# Patient Record
Sex: Male | Born: 1960 | Race: White | Hispanic: No
Health system: Southern US, Community
[De-identification: ages and names within clinical notes are randomized; demographics above are authoritative.]

---

## 2008-02-22 ENCOUNTER — Ambulatory Visit: Payer: Self-pay | Admitting: Orthopedic Surgery

## 2008-03-18 ENCOUNTER — Ambulatory Visit: Payer: Self-pay | Admitting: Orthopedic Surgery

## 2008-03-26 ENCOUNTER — Inpatient Hospital Stay: Payer: Self-pay | Admitting: Orthopedic Surgery

## 2008-04-21 ENCOUNTER — Other Ambulatory Visit: Payer: Self-pay

## 2008-04-21 ENCOUNTER — Inpatient Hospital Stay: Payer: Self-pay | Admitting: Orthopedic Surgery

## 2009-06-12 IMAGING — CR RIGHT HIP - 1 VIEW
1 series · 1 of 1 positions shown · non-contrast
Comparison: none

REASON FOR EXAM: repair right hip dysplasia
COMMENTS:

PROCEDURE:     DXR - DXR HIP RIGHT ONE VIEW  - March 26, 2008 [DATE]
RESULT:     The patient is status post RIGHT hip arthroplasty. There is no
evidence of an immediate postoperative bone or hardware complication.

[view not recorded]
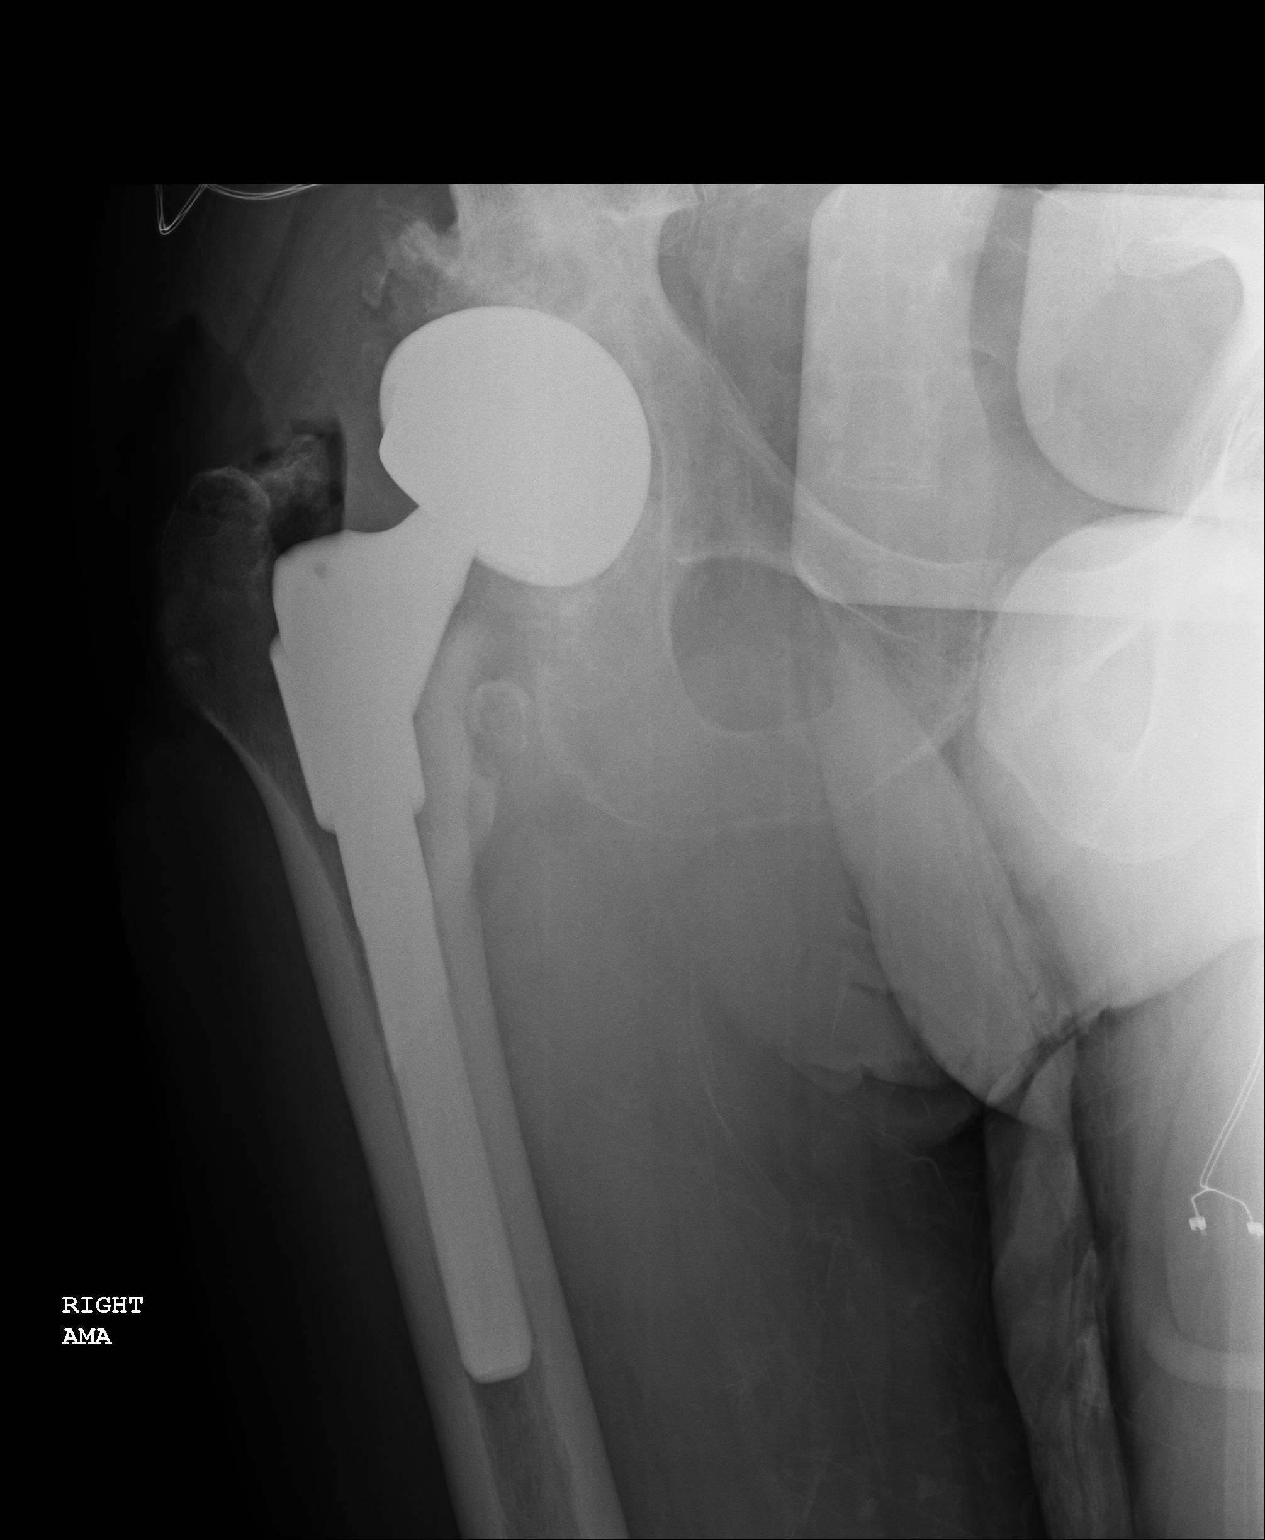

[1 of 1 positions shown; findings below may reference images not displayed]

IMPRESSION: Please see above.

## 2009-07-10 IMAGING — CR RIGHT HIP - 1 VIEW
1 series · 1 of 1 positions shown · non-contrast
Comparison: none

REASON FOR EXAM: rt hip pt in or
COMMENTS:

[view not recorded]
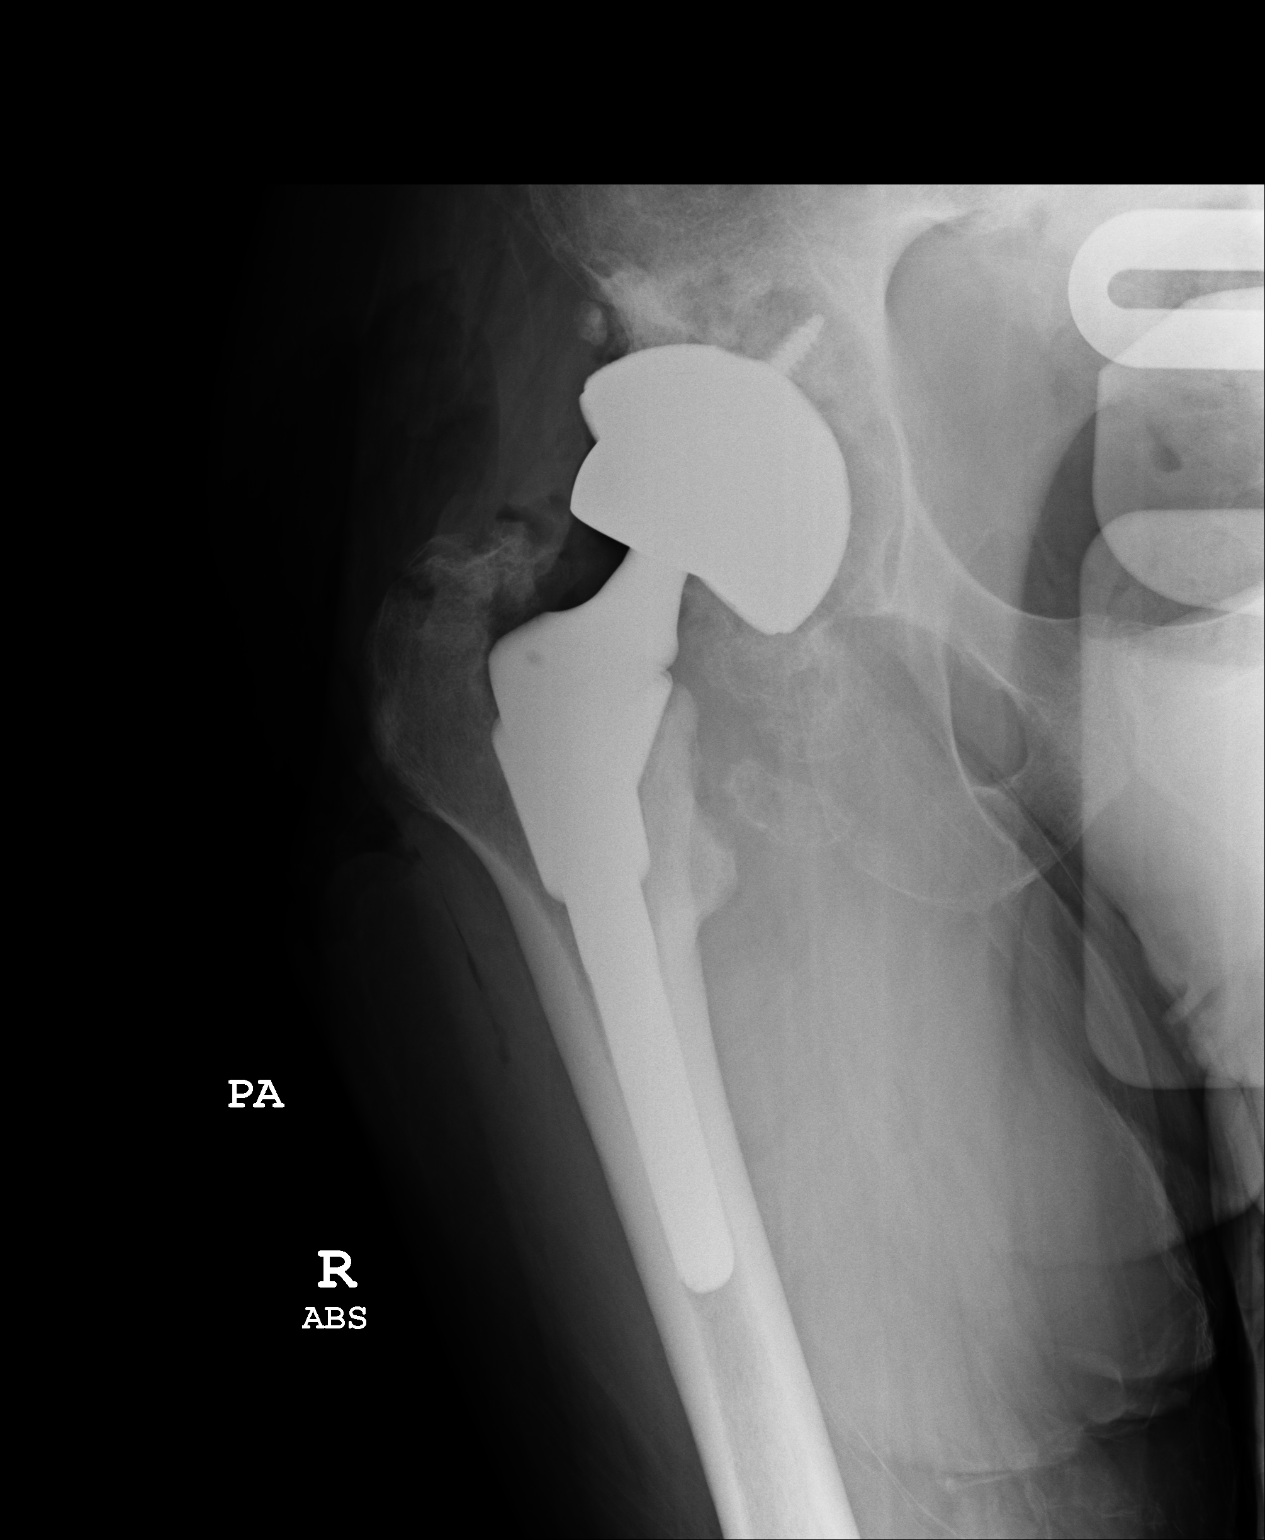

[1 of 1 positions shown; findings below may reference images not displayed]

PROCEDURE:     DXR - DXR HIP RIGHT ONE VIEW  - April 23, 2008  [DATE]

RESULT:     An AP view of the RIGHT hip was obtained and is compared to the
prior exam obtained earlier this date. The patient is again noted to have
had the femoral component of the hip prosthesis reassembled. The prosthetic
acetabulum is in place and is stabilized by a screw. No dislocation at
prosthetic hip joint is seen.
IMPRESSION: Please see above.

## 2009-07-10 IMAGING — CR RIGHT HIP - 1 VIEW
1 series · 1 of 1 positions shown · non-contrast
Comparison: none

REASON FOR EXAM: PT IN SURGERY
COMMENTS:

PROCEDURE:     DXR - DXR HIP RIGHT ONE VIEW  - April 23, 2008  [DATE]
RESULT:     An AP view of the RIGHT hip obtained at surgery is compared to a
prior exam 03/26/2008. A portion of the femoral component of the previously
present RIGHT hip prosthesis has been removed.

[view not recorded]
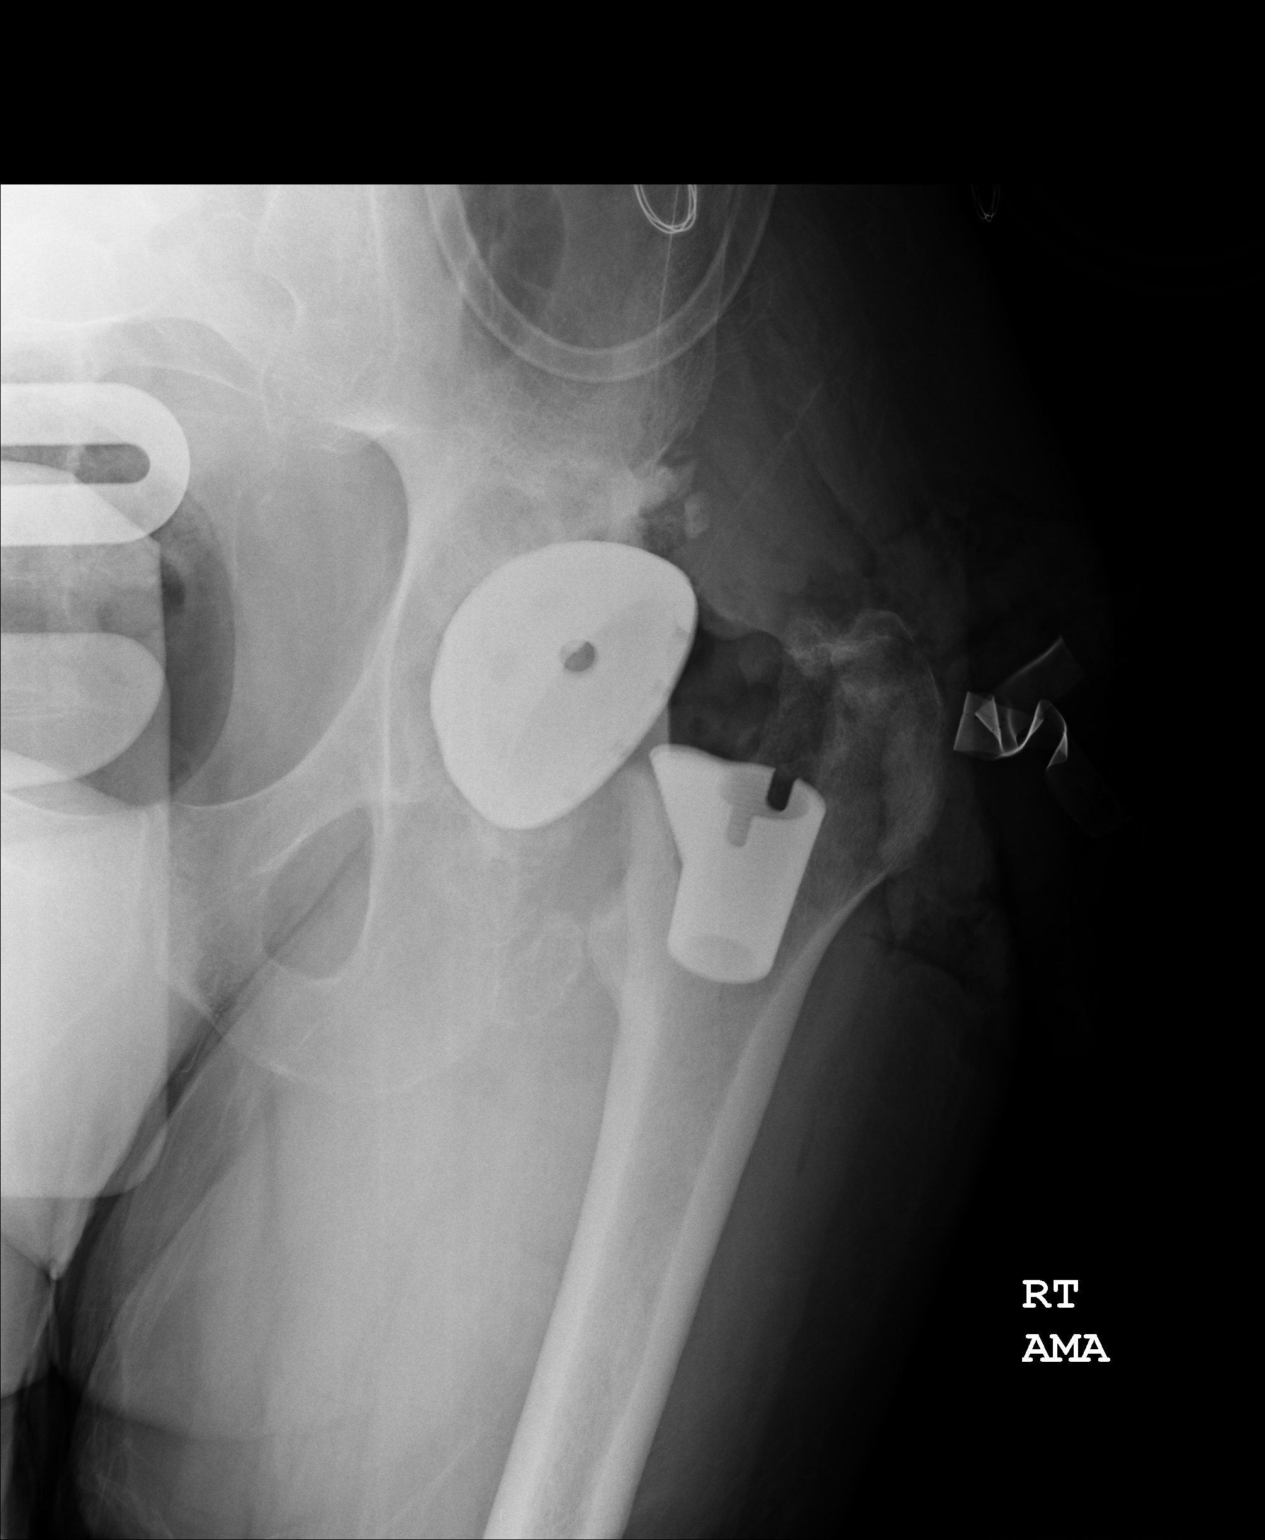

[1 of 1 positions shown; findings below may reference images not displayed]

IMPRESSION: Please see above.

## 2012-12-10 ENCOUNTER — Inpatient Hospital Stay: Payer: Self-pay | Admitting: Psychiatry

## 2012-12-10 LAB — BASIC METABOLIC PANEL
Anion Gap: 17 — ABNORMAL HIGH (ref 7–16)
Co2: 16 mmol/L — ABNORMAL LOW (ref 21–32)
EGFR (African American): >60
EGFR (Non-African Amer.): >60
Glucose: 154 mg/dL — ABNORMAL HIGH (ref 65–99)
Osmolality: 267 (ref 275–301)
Potassium: 4 mmol/L (ref 3.5–5.1)

## 2012-12-10 LAB — DRUG SCREEN, URINE
Amphetamines, Ur Screen: NEGATIVE (ref ?–1000)
Benzodiazepine, Ur Scrn: NEGATIVE (ref ?–200)
Cannabinoid 50 Ng, Ur ~~LOC~~: NEGATIVE (ref ?–50)
Cocaine Metabolite,Ur ~~LOC~~: NEGATIVE (ref ?–300)
MDMA (Ecstasy)Ur Screen: NEGATIVE (ref ?–500)
Methadone, Ur Screen: NEGATIVE (ref ?–300)
Tricyclic, Ur Screen: NEGATIVE (ref ?–1000)

## 2012-12-10 LAB — CBC WITH DIFFERENTIAL/PLATELET
Basophil #: 0.1 10*3/uL (ref 0.0–0.1)
Eosinophil %: 1.3 %
HCT: 39.8 % — ABNORMAL LOW (ref 40.0–52.0)
HGB: 13.8 g/dL (ref 13.0–18.0)
MCH: 38.8 pg — ABNORMAL HIGH (ref 26.0–34.0)
Monocyte %: 12.2 %
RBC: 3.55 10*6/uL — ABNORMAL LOW (ref 4.40–5.90)
RDW: 14.2 % (ref 11.5–14.5)
WBC: 6.4 10*3/uL (ref 3.8–10.6)

## 2012-12-10 LAB — HEPATIC FUNCTION PANEL A (ARMC)
Albumin: 3.9 g/dL (ref 3.4–5.0)
Alkaline Phosphatase: 132 U/L (ref 50–136)
Bilirubin, Direct: 0.8 mg/dL — ABNORMAL HIGH (ref 0.00–0.20)
SGPT (ALT): 125 U/L — ABNORMAL HIGH (ref 12–78)

## 2012-12-10 LAB — TSH: Thyroid Stimulating Horm: 2.36 u[IU]/mL

## 2012-12-10 LAB — URINALYSIS, COMPLETE
Bilirubin,UR: NEGATIVE
Blood: NEGATIVE
Glucose,UR: NEGATIVE mg/dL (ref 0–75)
Hyaline Cast: 21
Leukocyte Esterase: NEGATIVE
Nitrite: NEGATIVE
Ph: 7 (ref 4.5–8.0)
Squamous Epithelial: NONE SEEN
WBC UR: 2 /HPF (ref 0–5)

## 2012-12-10 LAB — MAGNESIUM: Magnesium: 1.8 mg/dL

## 2012-12-11 LAB — BASIC METABOLIC PANEL
Calcium, Total: 8.6 mg/dL (ref 8.5–10.1)
Co2: 24 mmol/L (ref 21–32)
Creatinine: 0.56 mg/dL — ABNORMAL LOW (ref 0.60–1.30)
EGFR (African American): >60
EGFR (Non-African Amer.): >60
Glucose: 85 mg/dL (ref 65–99)
Potassium: 3.6 mmol/L (ref 3.5–5.1)
Sodium: 139 mmol/L (ref 136–145)

## 2012-12-11 LAB — CBC WITH DIFFERENTIAL/PLATELET
Basophil #: 0 10*3/uL (ref 0.0–0.1)
Basophil %: 0.6 %
Eosinophil #: 0.1 10*3/uL (ref 0.0–0.7)
Eosinophil %: 2.2 %
Monocyte %: 16.3 %
Neutrophil %: 55.4 %
Platelet: 71 10*3/uL — ABNORMAL LOW (ref 150–440)
RBC: 3.33 10*6/uL — ABNORMAL LOW (ref 4.40–5.90)
WBC: 3.1 10*3/uL — ABNORMAL LOW (ref 3.8–10.6)

## 2012-12-11 LAB — HEMOGLOBIN A1C: Hemoglobin A1C: 5.1 % (ref 4.2–6.3)

## 2012-12-11 LAB — HEPATIC FUNCTION PANEL A (ARMC)
Albumin: 3.5 g/dL (ref 3.4–5.0)
Alkaline Phosphatase: 107 U/L (ref 50–136)
Bilirubin, Direct: 0.6 mg/dL — ABNORMAL HIGH (ref 0.00–0.20)
SGOT(AST): 176 U/L — ABNORMAL HIGH (ref 15–37)
SGPT (ALT): 110 U/L — ABNORMAL HIGH (ref 12–78)
Total Protein: 7 g/dL (ref 6.4–8.2)

## 2012-12-11 LAB — MAGNESIUM: Magnesium: 2.3 mg/dL

## 2012-12-12 LAB — CBC WITH DIFFERENTIAL/PLATELET
Basophil %: 0.6 %
Eosinophil #: 0.1 10*3/uL (ref 0.0–0.7)
Eosinophil %: 2.5 %
HCT: 35.2 % — ABNORMAL LOW (ref 40.0–52.0)
HGB: 12.4 g/dL — ABNORMAL LOW (ref 13.0–18.0)
Lymphocyte #: 0.9 10*3/uL — ABNORMAL LOW (ref 1.0–3.6)
Lymphocyte %: 24.7 %
MCH: 39.3 pg — ABNORMAL HIGH (ref 26.0–34.0)
MCHC: 35.2 g/dL (ref 32.0–36.0)
MCV: 112 fL — ABNORMAL HIGH (ref 80–100)
Monocyte #: 0.7 x10 3/mm (ref 0.2–1.0)
Monocyte %: 19.8 %
Platelet: 70 10*3/uL — ABNORMAL LOW (ref 150–440)
RDW: 14.1 % (ref 11.5–14.5)
WBC: 3.5 10*3/uL — ABNORMAL LOW (ref 3.8–10.6)

## 2014-08-16 DEATH — deceased

## 2014-12-06 NOTE — H&P (Signed)
PATIENT NAME:  Patrick Jennings, Patrick Jennings MR#:  [id number] DATE OF BIRTH:  Oct 09, 1960  DATE OF ADMISSION:  12/10/2012  PCP: None.  REFERRING PHYSICIAN: Dr. Shaune Jennings.   CHIEF COMPLAINT: Status post seizure.   HISTORY OF PRESENT ILLNESS: The patient is a pleasant 54 year old Caucasian male, who is accompanied by his parents currently. Per parents, about 8:45 this [name]. he was noted to have a tonic-clonic seizure activity lasting about 10 minutes with tongue biting and urine incontinence. He was sitting on the recliner and he was brought down by his parents where he had a total of 10 minutes of seizure activity. EMS was called. Here he was given Ativan. U-tox is negative. Medicine was consulted for evaluation and management.   Of note, the patient had been living by himself for up to 15 years prior to moving in with his parents on Friday as parents suspected that he was having ongoing alcohol issues and brought him in so they can take care of him. His mom is a previous nurse. He states that his last alcohol intake was several months ago; however, his parents suspect that he has been drinking recently; however, parents state that he has never drunk in front of them because of respect issues, but he had been possibly drinking in his house. 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 He was also seen by psychiatry while here and deemed incompetent per ER physician. Currently, he is tremulous, anxious and tachycardic.   PAST MEDICAL HISTORY: History of prior hip surgery, history of arthritis and psoriasis.   ALLERGIES: None.   OUTPATIENT MEDICATIONS: Ibuprofen p.r.n.   SOCIAL HISTORY: No tobacco or drugs. He does not work. Alcohol abuse as above. 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   FAMILY HISTORY: Some history of seizures.   REVIEW OF SYSTEMS:   CONSTITUTIONAL: No fever, fatigue,  weakness or weight changes.  EYES: Denies blurry vision or glaucoma.  ENT: No tinnitus or hearing loss. He did bite his tongue with some bleeding from his tongue while he had the seizure.  RESPIRATORY: Denies cough, wheezing, hemoptysis, shortness of breath.  CARDIOVASCULAR: Denies chest pain, orthopnea or hypertension.  GASTROINTESTINAL: Denies nausea, vomiting, diarrhea, abdominal pain. Denies rectal bleeding or ulcers.  GENITOURINARY: Denies dysuria, hematuria.  HEMATOLOGIC/LYMPHATIC: Denies anemia or easy bruising. SKIN: Psoriasis.  MUSCULOSKELETAL: Has chronic arthritis. Denies numbness, weakness or dysarthria. Denies history of stroke or previous seizures.  PSYCHIATRIC: Occasional insomnia.   PHYSICAL EXAMINATION:  VITAL SIGNS: Temperature on arrival 98.3, pulse 116, respiratory rate 20, blood pressure 159/100, oxygen saturation 96% on room air.  GENERAL: The patient is a well developed, Caucasian male sitting in bed in no obvious distress, somewhat anxious, tremulous and unkempt.  HEENT: Normocephalic, atraumatic. Pupils are equal and reactive. Anicteric sclerae. Extraocular muscles intact. There is several bite marks lateral tongue on both sides without obvious bleeding currently.  NECK: Supple. No thyroid tenderness or cervical lymphadenopathy. No JVD.  CARDIOVASCULAR: S1, S2, tachycardic. No murmurs, rubs, or gallops.  LUNGS: Clear to auscultation. No wheezing or rhonchi.  ABDOMEN: Soft. Hyperactive bowel sounds in all quadrants. No tenderness.  EXTREMITIES: No significant lower extremity edema.  NEUROLOGIC: Cranial nerves II through XII appear to be grossly intact. Strength is 5 out of 5 in all extremities. Sensation is intact to light touch.  PSYCHIATRIC: Awake, alert, oriented x 3. Anxious and fidgety. The patient has significant tremor of his upper extremities as well and examination of the skin shows scattered diffuse subcentimeter plaques in patches, most notable in the lower  extremities, more so than upper extremities and left on the trunk.   LABORATORIES: CT of the head without contrast showing no acute intracranial process, 2 focal areas of white matter low attenuation in the left frontal lobe and right parietal lobe, which were symmetric and may also be related to microvascular disease, but demyelinating process or vasculitis are in differential diagnosis.   EKG: Sinus tach, rate is 105, no acute ST elevations or depressions. There are some peaked T waves in the precordial leads. U-tox negative. Alcohol level below detection. Sodium 132, potassium 4, serum CO2 is 16, anion gap of 17, glucose 154, BUN 10, creatinine 0.88. WBC 6.4, hemoglobin 13.8, platelets 75 and MCV of 12. Urinalysis negative.   ASSESSMENT AND PLAN: We have a 54 year old Caucasian male with a history of prolonged alcohol abuse, psoriasis and arthritis without history of seizures with new seizure tonic-clonic in nature, associated with tongue biting and urine incontinence. Although he denies it, the physical exam, symptoms and some lab abnormalities suggest that he has been drinking recently and likely had withdrawal seizure as he has been taken in by his parents in the last couple of days and he had not drank since; however, he denies drinking recently, but did not refute me after I stated that I suspected he had been drinking late last week. As this is the first seizure, I would not start AEDs at this point; however, start him on CIWA protocol, a banana bag, seizure precautions and frequent neuro checks. I would obtain an MRI of the brain to further evaluate findings seen on the CAT scan as well as do an EEG. I would start him on Librium standing as well as the CIWA protocol given the alcohol withdrawal symptoms h is having right now including anxiety, tachycardia and tremors with a seizure. I would check a magnesium. His U-tox is negative and he does want detox at this point it. He does have  thrombocytopenia, which I suspect is from alcohol. Furthermore, he appears to have metabolic acidosis, anion gap which could also be from alcohol on board. I would put him on a diet and if there is any significant MRI abnormalities, we will consider doing neuro consult. He does have mild hyponatremia, which should correct as well. He has elevated an glucose level and I will check a hemoglobin A1c as well. He was counseled extensively about alcohol in front of his parents and it is unclear if he has desire to quit at this point or not and that has yet to be seen; however, at this point, he does want detox "so I can move on with my life."   TOTAL TIME SPENT: 60 minutes.   CODE STATUS: The patient is full code.   ____________________________ Krystal Eaton, MD sa:aw D: 12/10/2012 13:08:35 ET T: 12/10/2012 13:24:55 ET JOB#: [id number]  cc: Krystal Eaton, MD, <Dictator> Krystal Eaton MD ELECTRONICALLY SIGNED 01/08/2013 13:54

## 2014-12-06 NOTE — Consult Note (Signed)
PATIENT NAME:  Patrick Jennings, Patrick Jennings MR#:  [id number] DATE OF BIRTH:  04-Jun-1961  DATE OF CONSULTATION:  12/10/2012  REFERRING PHYSICIAN:  Governor Rooks, MD CONSULTING PHYSICIAN:  Ardeen Fillers. Garnetta Buddy, MD  REASON FOR CONSULTATION: History of seizure, competency evaluation.   HISTORY OF PRESENT ILLNESS: The patient is a 54 year old single white male who was evaluated in the Emergency Department at the request of Dr. Shaune Pollack. The patient presented to the ED accompanied by his parent. According to the initial reports, patient has been living with his parents for the past 3 days. His parents were concerned because of the patient's excessive use of alcohol. The parents reported that patient had witnessed seizures earlier this morning. The patient reported that he was sitting on the couch around 8:45 a.m. when he passed out. His mother reported that she was talking to him and as she stepped out she saw the patient jerking on the sofa. She reported that she is a Engineer, civil (consulting) and she can tell if the patient was having actually a seizure. He was jerking for approximately 5 to 6 minutes. She stated that she called the ambulance and the patient was brought to the hospital. The patient reported that he does not remember anything except for coming to the hospital in the ambulance. The patient stated that he has not been drinking for the past 6 months. His mother, however, on the other side, reported that the patient was not drinking in the house but she can see the "signs." The patient continues to minimize his drinking but was noted to be tremulous and has a flushed face during the interview.   He reported that he is currently on probation for excessive drinking and has 2 DUIs in the past 18 months. He is currently on 18 months probation and has already completed 3 months out of that. He has to see his probation officer on a monthly basis. His urine drug screen was checked at that time. The patient reported that he has been following the  regulations. He reported that he does not think that he needs any treatment for his alcohol level. He reported that he is doing well on the AA meetings. The patient reported that he has not had a drink recently. His mother and father were very much concerned about his use of alcohol and they reported that he needs to get help. 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 He knows the consequences of using alcohol at this time and he does not have any depression, suicidal ideation or anxiety symptoms.   PAST PSYCHIATRIC HISTORY: The patient reported that he has never been admitted to a psychiatric hospital and has never been to a rehab program. He reported that he does not feel anxious, depressed and does not have any thoughts to harm himself. He reported that he drinks alcohol just socially. He was minimizing his use of alcohol. He reported that he has not had a drink in the past 6 months. He denies any treatment for detox or rehab.   PAST MEDICAL HISTORY: The patient has a history of a total knee placement. He denies having any other medical issues.   ALLERGIES: No known drug allergies.   CURRENT MEDICATIONS: None.   SOCIAL HISTORY: The patient currently lives by himself, however, he has recently moved to his parents' house and has been living there for the past 3 days.  ANCILLARY DATA AND LABS:  Temperature 98.3, pulse 108, respirations 18, blood pressure 126/78. Glucose 154, BUN 10, creatinine 0.88, sodium 132, potassium 4.0, chloride 99, bicarbonate 16, anion gap 17, osmolality 267, calcium 9.0. Blood alcohol less than 3. Urine drug screen is negative. WBC 6.4, RBC 3.5, hemoglobin 13.8, hematocrit 39.8, platelet count 75, MCV 112, MCH 38.8, RDW 14.2.   MENTAL STATUS EXAMINATION: The patient is a moderately-built male who appears somewhat disheveled and has a large beard. His face was flushed.  He maintained  fair eye contact. His speech was low in tone and volume. He appeared somewhat anxious during the interview. He demonstrated poor insight and judgment regarding his use of alcohol. He denied having any suicidal or homicidal ideations or plans.   DIAGNOSTIC IMPRESSION: AXIS I: 1.  Alcohol dependence.  2.  Alcohol-induced mood disorder.  AXIS II: None.  AXIS III: History of knee replacement.   TREATMENT PLAN:  I discussed with the patient and family at length about the consequences of alcohol use. The patient understands them fully as well as the reason for coming into the hospital, however, he does not want to get any help at this time. He reported that he knows what will happen if he will not seek any help. He reported that he wants to go back home and take care of some of his business.  I discussed with the patient  that he should go to the rehab program  but he declined at this time. He does not want to seek any treatment at this time.  Discussed his case with Dr. Shaune PollackLord as well and she agreed and then she will discuss the patient and family about being admitted to the hospital. If the patient agreed, he can be admitted to the medical floor for further treatment of his seizure disorder.  No psychotropic medications will be prescribed for the patient at this time.   Thank you for allowing me to participate in the care of this patient.   ____________________________ Ardeen FillersUzma S. Garnetta BuddyFaheem, MD usf:cs D: 12/10/2012 12:55:27 ET T: 12/10/2012 15:28:08 ET JOB#: [id number]  cc: Ardeen FillersUzma S. Garnetta BuddyFaheem, MD, <Dictator> Rhunette CroftUZMA S Iniya Matzek MD ELECTRONICALLY SIGNED 12/11/2012 10:35

## 2014-12-06 NOTE — Discharge Summary (Signed)
PATIENT NAME:  Patrick HeinzSELF, Everest M MR#:  [id number] DATE OF BIRTH:  [birth year]  PRIMARY CARE PHYSICIAN:  None local.  DATE OF ADMISSION:  12/10/2012  DATE OF DISCHARGE:  12/12/2012  DISCHARGE DIAGNOSES: 1.  Seizure.  2.  Alcohol withdrawal.  3.  Neutropenia.  4.  Thrombocytopenia.  5.  Questionable multiple sclerosis.   CONDITION:  Stable.   CODE STATUS:  FULL CODE.   HOME MEDICATIONS: Folic acid 1 mg p.o. daily, thiamine 100 mg p.o. daily.   DIET:  Regular diet.   ACTIVITY: As tolerated.   FOLLOWUP CARE:  Follow up with PCP within one week, and follow up neurologist within one week.   REASON FOR ADMISSION:  Status post seizure.   HOSPITAL COURSE: The patient is a 54 year old Caucasian male with a history of arthritis, psoriasis, hip surgery, presented in ED with status post seizure. The patient usually drinks 6 beers daily and up to 9 to 10 over a weekend. The patient stopped drinking a few weeks ago. The patient was tremulous, anxious, and tachycardic in the ED. For detailed history and physical examination, please refer to the admission note dictated by Dr. Jacques NavyAhmadzia on admission date. The patient's CAT scan of head without contrast does show no acute intracranial process, 2 focal areas of white matter low attenuation in the left frontal lobe and right parietal lobe. The patient's sodium was 132, potassium 4, bicarb 16. Alcohol level was below detection.  BUN 10, creatinine 0.88. Hemoglobin 13.8. The patient was admitted for a seizure, possibly due to alcohol withdrawal. After admission, he has been kept on seizure precautions, CIWA protocol, and banana bag IV. The patient's EEG showed mild diffuse slowing. MRI of the brain showed questionable multiple sclerosis, but the patient denies any headache, dizziness, numbness or weakness.  Physical examination is unremarkable. The patient has no seizure after admission. No tremor or shaking, anxiety or agitation. We do not have a neurology cover  this week. Since patient has no symptoms, physical examination is unremarkable. Vital signs are stable.   The patient will be discharged to home, and follow up PCP and neurologist as outpatient. I discussed patient's discharge plan with the patient and case manager. In addition, patient was evaluated by Behavior Medicine physician, Dr. Garnetta BuddyFaheem during admission. Dr. Garnetta BuddyFaheem  discussed with the patient about rehab program, but the patient declined. He did not want to seek any treatment at this time. The patient will be discharged to home today. I discussed the patient's discharge plan with the patient and case manager.   TIME SPENT: About 35 minutes.    ____________________________ Shaune PollackQing Aqil Goetting, MD qc:mr D: 12/12/2012 16:34:00 ET T: 12/12/2012 20:22:15 ET JOB#: [id number]  cc: Shaune PollackQing Narya Beavin, MD, <Dictator> Shaune PollackQING Kinnley Paulson MD ELECTRONICALLY SIGNED 12/13/2012 14:37
